# Patient Record
Sex: Male | Born: 2000 | Race: White | Hispanic: No | Marital: Single | Smoking: Never smoker
Health system: Southern US, Community
[De-identification: ages and names within clinical notes are randomized; demographics above are authoritative.]

---
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
## 2018-09-08 IMAGING — DX DG KNEE COMPLETE 4+V*R*
4 series · 4 of 4 positions shown · non-contrast
Comparison: None.

CLINICAL DATA: Right knee pain for 2 days.  No injury.

EXAM:
RIGHT KNEE - COMPLETE 4+ VIEW

[knee ap]
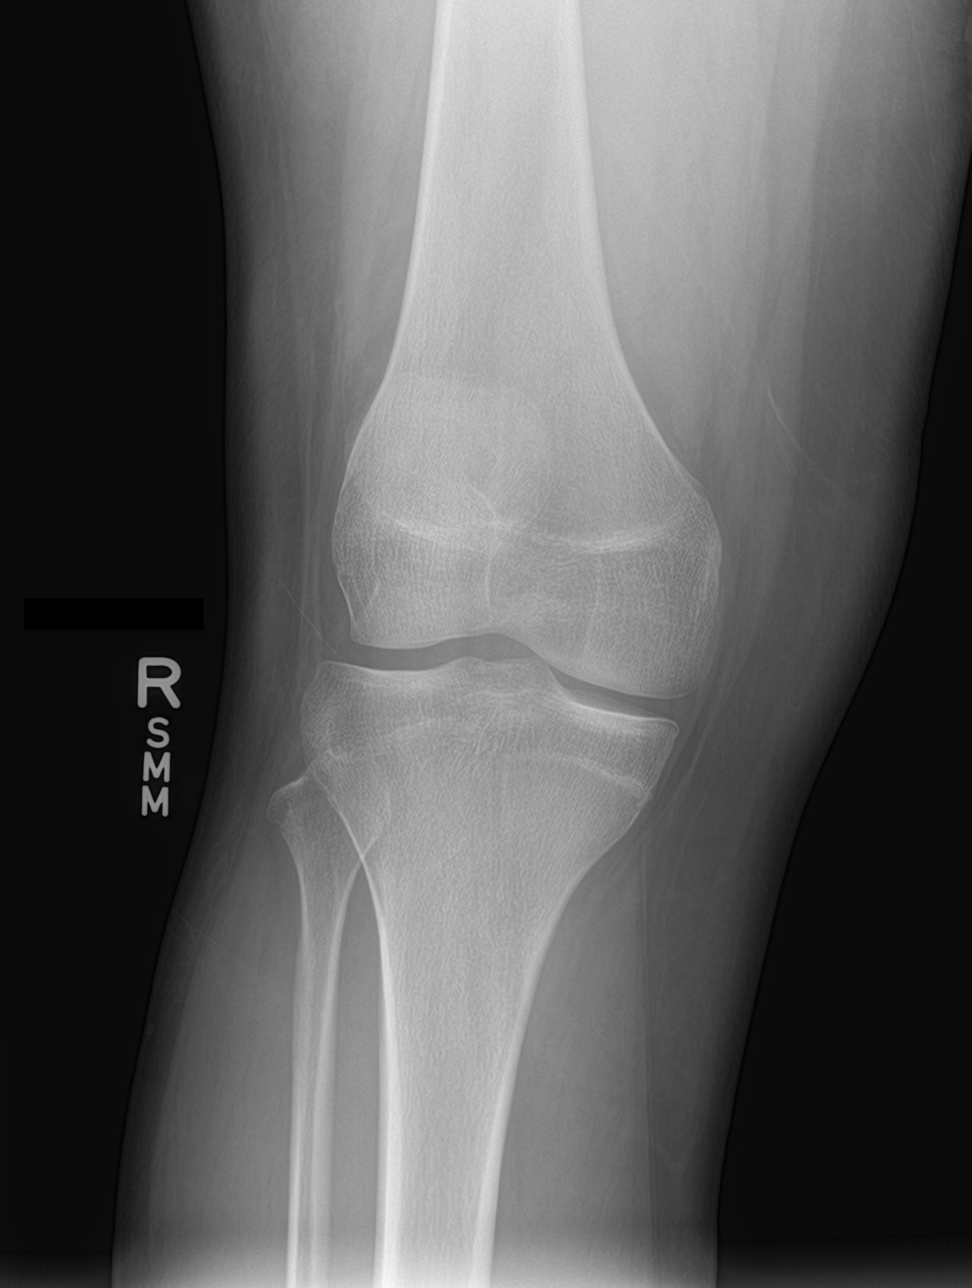

[knee lat]
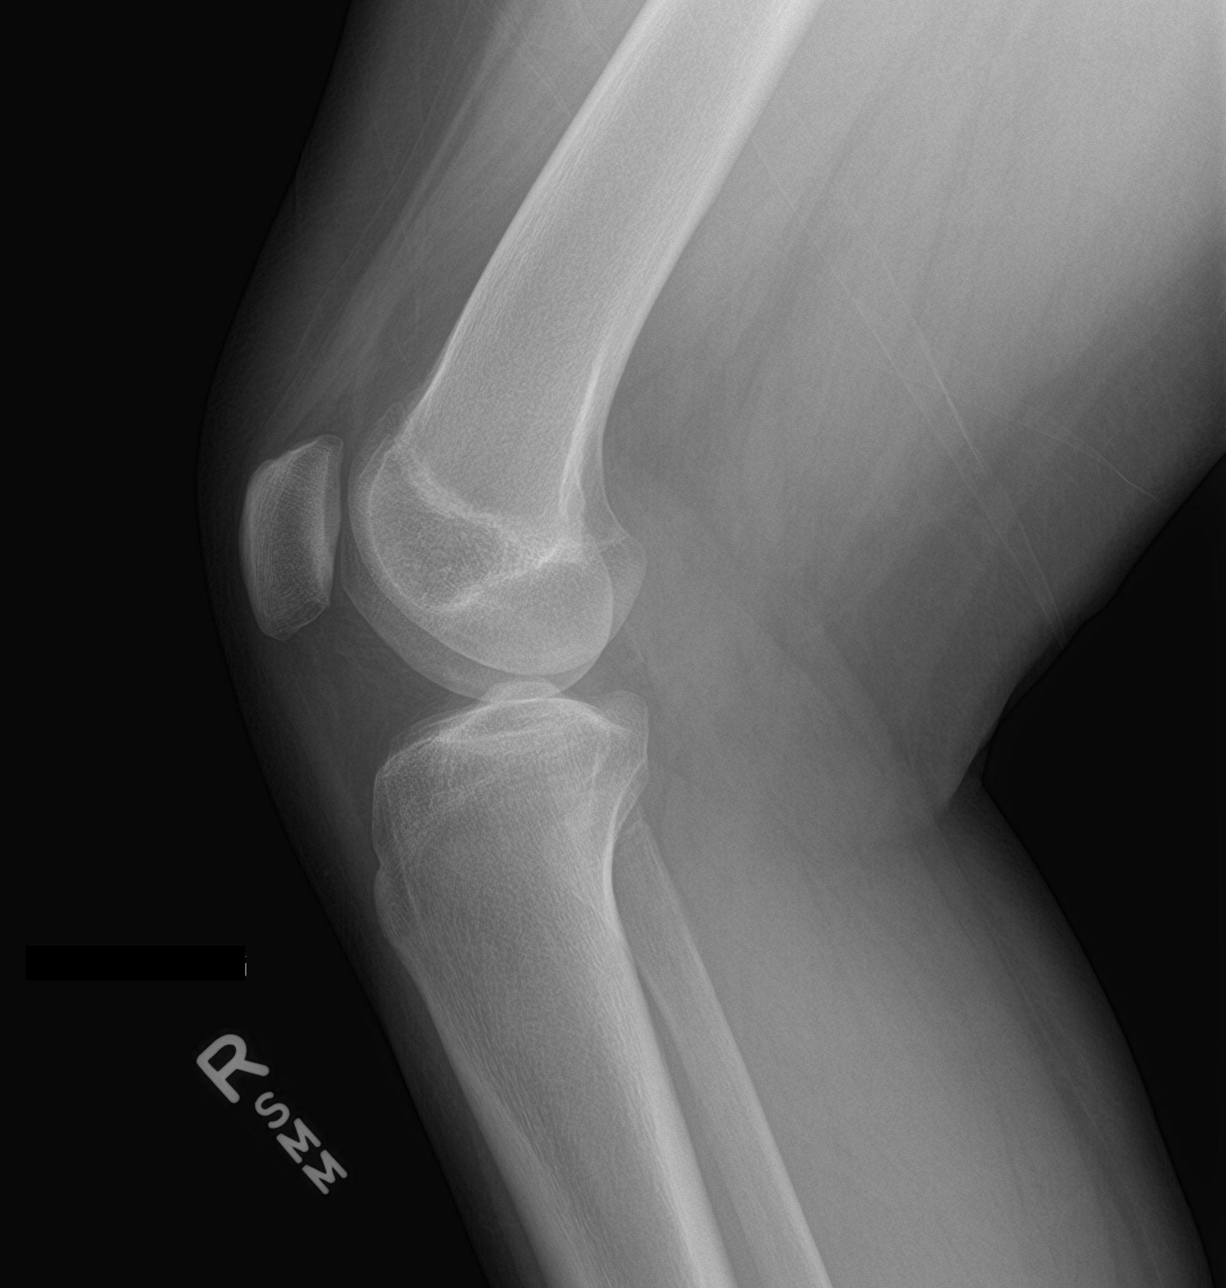

[knee [person_name]]
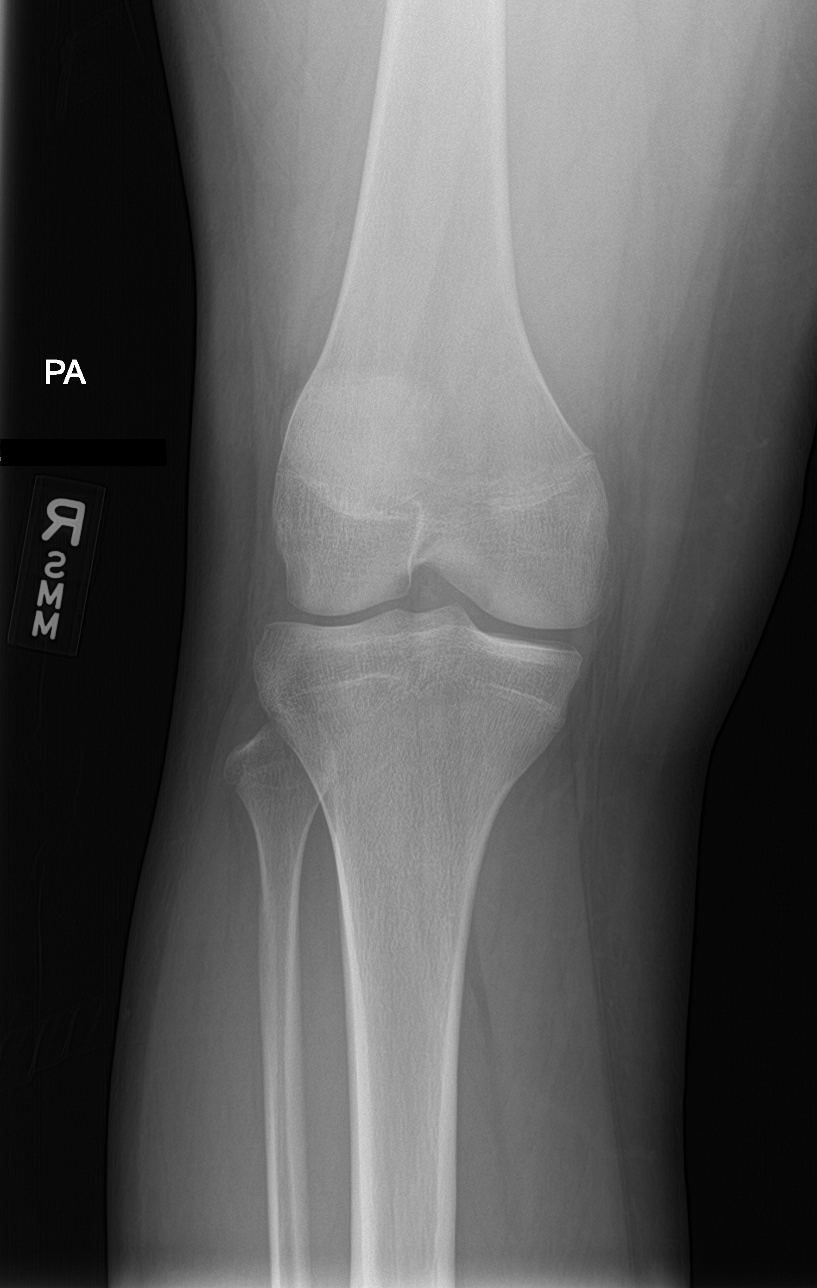

[knee sunrise]
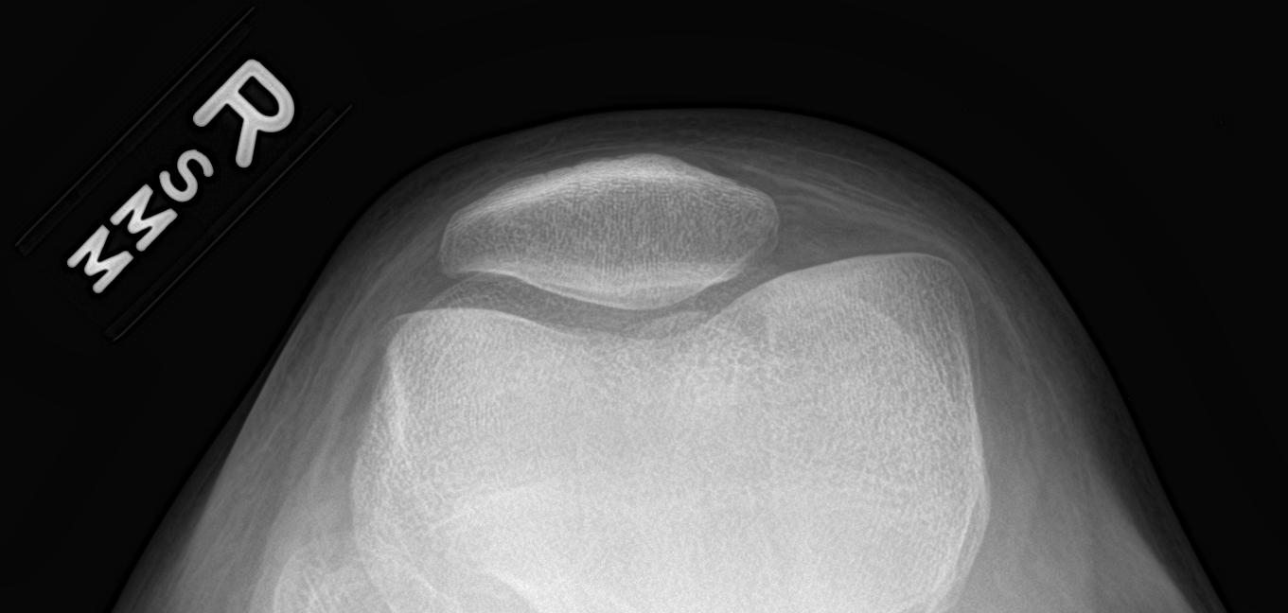

[4 of 4 positions shown; findings below may reference images not displayed]

FINDINGS: No evidence of fracture, dislocation, or joint effusion. No evidence
of arthropathy or other focal bone abnormality. Soft tissues are
unremarkable.
IMPRESSION: Negative.
